# Patient Record
Sex: Female | Born: 1985 | Hispanic: No | Marital: Married
Health system: Southern US, Community
[De-identification: ages and names within clinical notes are randomized; demographics above are authoritative.]

## PROBLEM LIST (undated history)

## (undated) DIAGNOSIS — Z789 Other specified health status: Secondary | ICD-10-CM

## (undated) HISTORY — PX: NO PAST SURGERIES: SHX2092

---

## 2015-05-28 ENCOUNTER — Other Ambulatory Visit (HOSPITAL_COMMUNITY): Payer: Self-pay | Admitting: Internal Medicine

## 2015-05-28 ENCOUNTER — Ambulatory Visit (HOSPITAL_COMMUNITY)
Admission: RE | Admit: 2015-05-28 | Discharge: 2015-05-28 | Disposition: A | Discharge status: Home / Self Care | Payer: 59 | Source: Ambulatory Visit | Attending: Internal Medicine | Admitting: Internal Medicine

## 2015-05-28 DIAGNOSIS — M150 Primary generalized (osteo)arthritis: Secondary | ICD-10-CM

## 2015-05-28 DIAGNOSIS — M542 Cervicalgia: Secondary | ICD-10-CM | POA: Diagnosis not present

## 2015-05-28 DIAGNOSIS — M1990 Unspecified osteoarthritis, unspecified site: Secondary | ICD-10-CM | POA: Insufficient documentation

## 2016-10-04 IMAGING — DX DG CERVICAL SPINE COMPLETE 4+V
6 series · 6 of 6 positions shown · non-contrast
Comparison: None.

CLINICAL DATA: Neck pain x2 weeks, osteoarthritis

EXAM:
CERVICAL SPINE - COMPLETE 4+ VIEW

[c-spine lat]
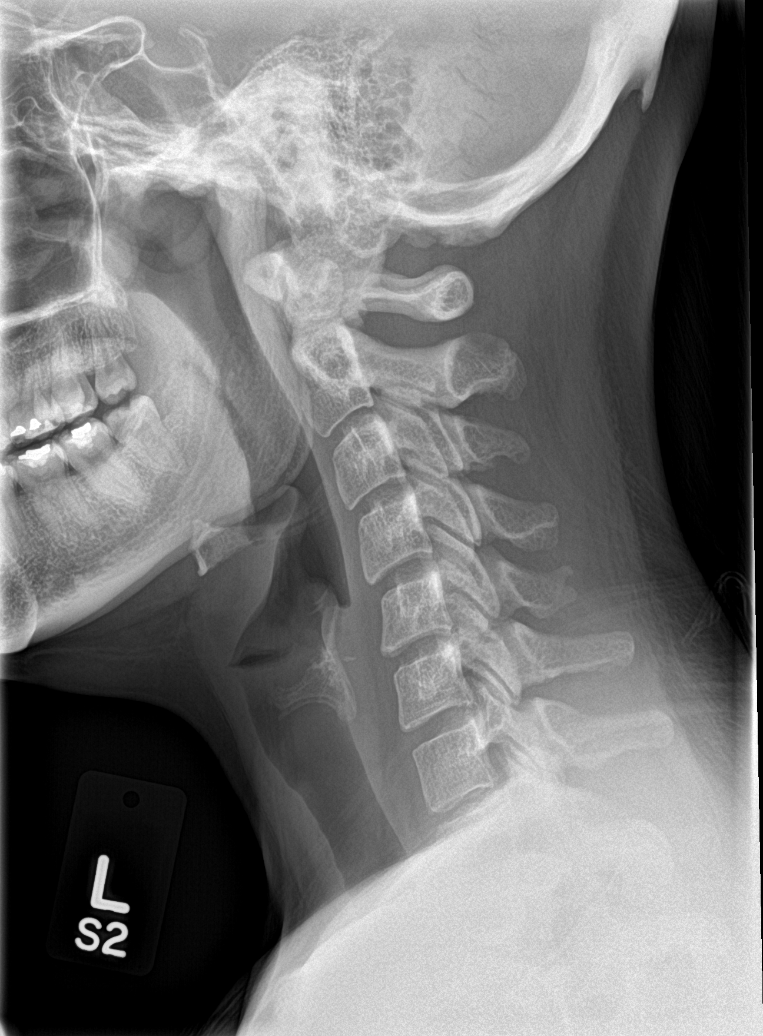

[c-spine obl (1 of 2)]
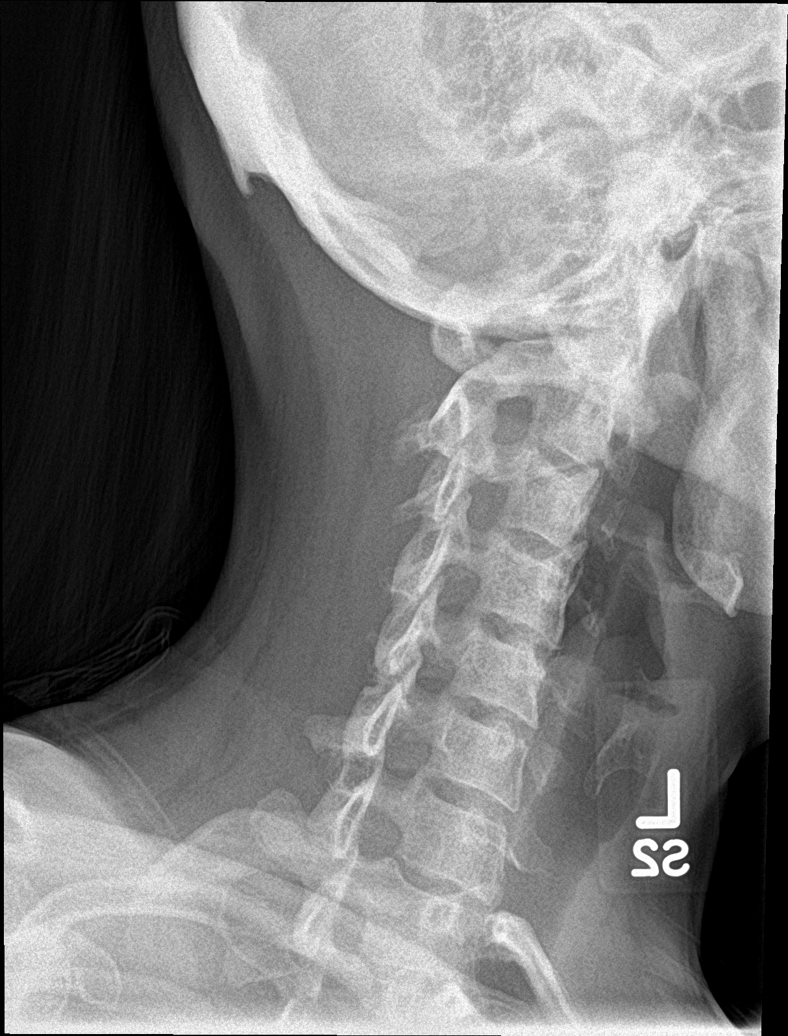

[c-spine obl (2 of 2)]
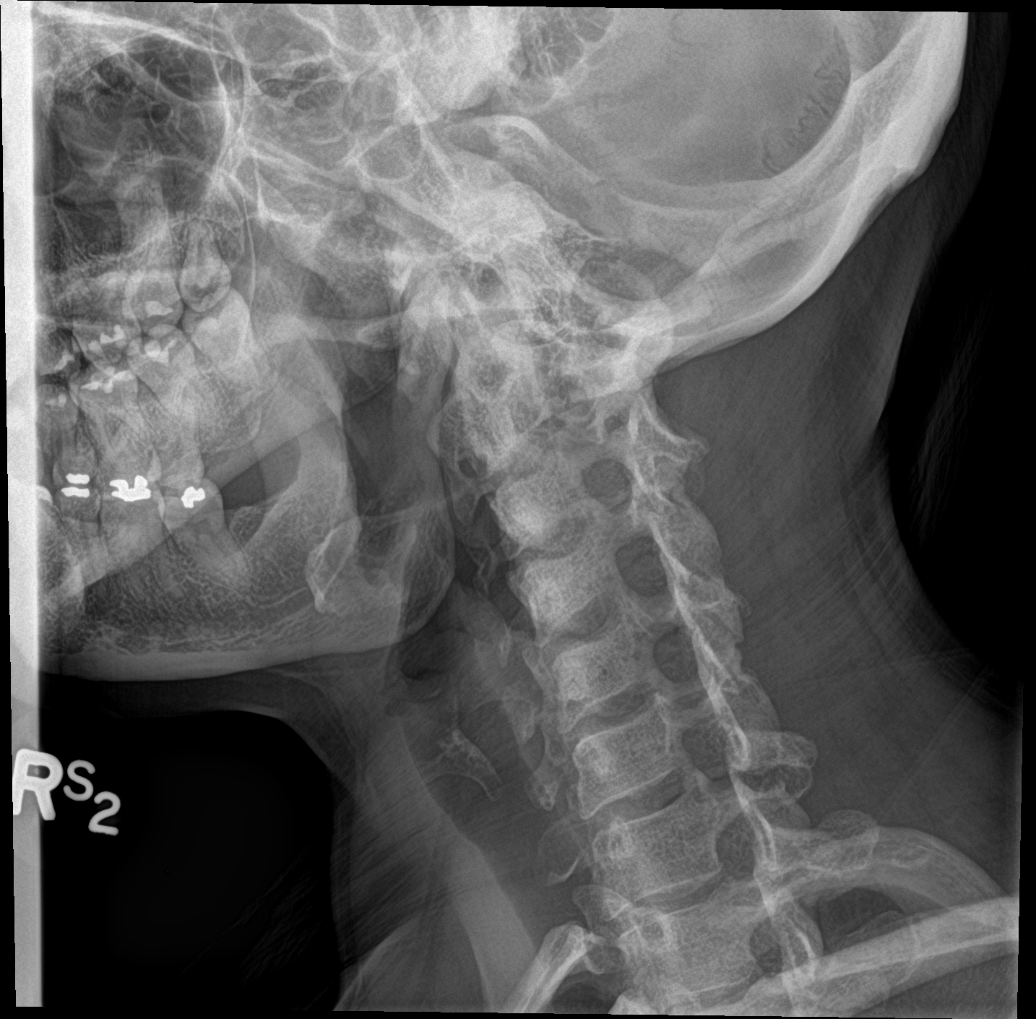

[c-spine ap]
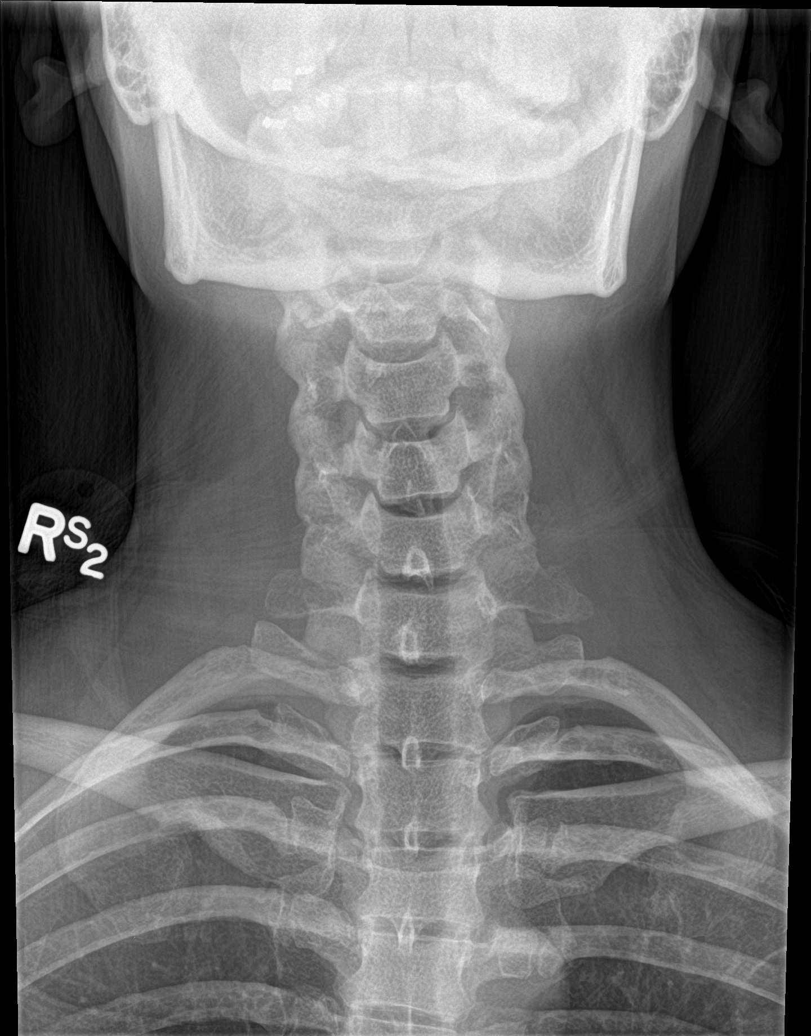

[c-spine open mouth]
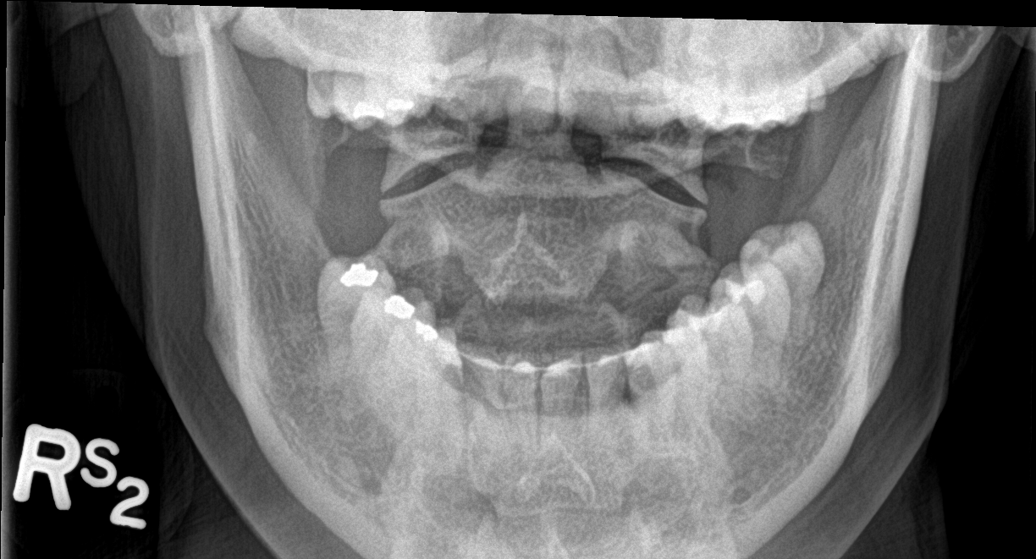

[[person_name]]
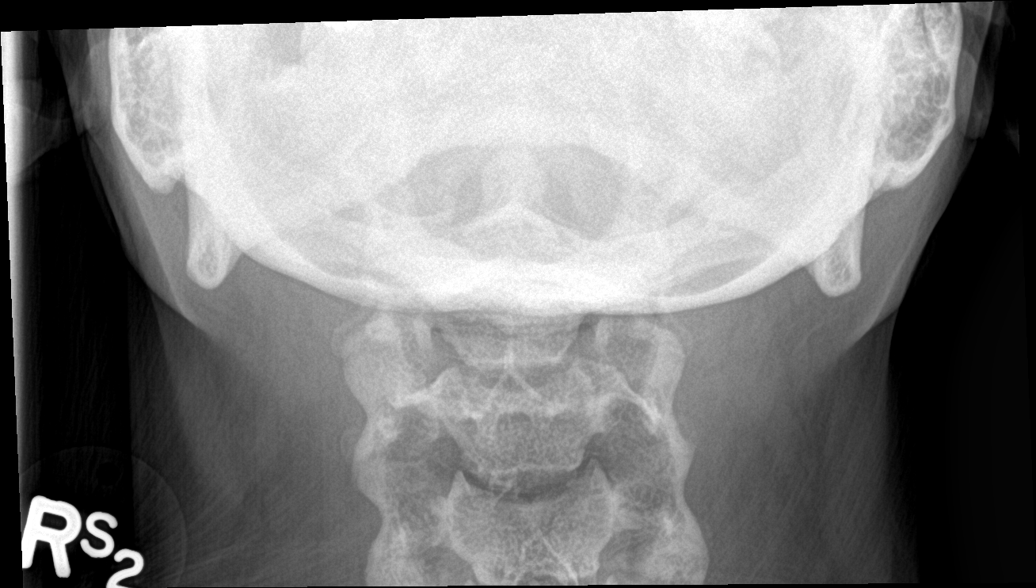

[6 of 6 positions shown; findings below may reference images not displayed]

FINDINGS: Cervical spine is visualized C7-T1 on the lateral view.

Reversal of the normal cervical lordosis, likely positional.

No evidence of fracture or dislocation. Vertebral body heights and
intervertebral disc spaces are maintained. Dens appears intact.
Lateral masses of C1 are symmetric.

No prevertebral soft tissue swelling.

Bilateral neural foramina are patent.

Visualized lung apices are clear.
IMPRESSION: Normal cervical spine radiographs.

## 2018-02-19 LAB — OB RESULTS CONSOLE GC/CHLAMYDIA
CHLAMYDIA, DNA PROBE: NEGATIVE
Gonorrhea: NEGATIVE

## 2018-02-19 LAB — OB RESULTS CONSOLE HIV ANTIBODY (ROUTINE TESTING): HIV: NONREACTIVE

## 2018-02-19 LAB — OB RESULTS CONSOLE RPR: RPR: NONREACTIVE

## 2018-02-19 LAB — OB RESULTS CONSOLE GBS: GBS: POSITIVE

## 2018-02-19 LAB — OB RESULTS CONSOLE ABO/RH: RH Type: POSITIVE

## 2018-02-19 LAB — OB RESULTS CONSOLE ANTIBODY SCREEN: ANTIBODY SCREEN: NEGATIVE

## 2018-02-19 LAB — OB RESULTS CONSOLE RUBELLA ANTIBODY, IGM: Rubella: IMMUNE

## 2018-02-19 LAB — OB RESULTS CONSOLE HEPATITIS B SURFACE ANTIGEN: Hepatitis B Surface Ag: NEGATIVE

## 2018-07-31 NOTE — L&D Delivery Note (Signed)
Patient was C/C/+2 and pushed for about 15 minutes with epidural.    NSVD  female infant, Apgars 9,9, weight P.   The patient had a midline second degree laceration repaired with 2-0. Fundus was firm. EBL was expected amount. Placenta was delivered intact. Vagina was clear.  Delayed cord clamping done for 30-60 seconds while warming baby. Baby was vigorous and doing skin to skin with mother.  Vicki Bright

## 2018-08-22 ENCOUNTER — Other Ambulatory Visit: Payer: Self-pay | Admitting: Obstetrics and Gynecology

## 2018-08-23 ENCOUNTER — Encounter (HOSPITAL_COMMUNITY): Payer: Self-pay

## 2018-08-23 ENCOUNTER — Inpatient Hospital Stay (HOSPITAL_COMMUNITY)
Admission: AD | Admit: 2018-08-23 | Discharge: 2018-08-25 | DRG: 807 | Disposition: A | Discharge status: Home / Self Care | Payer: Medicaid Other | Attending: Obstetrics and Gynecology | Admitting: Obstetrics and Gynecology

## 2018-08-23 ENCOUNTER — Other Ambulatory Visit: Payer: Self-pay

## 2018-08-23 ENCOUNTER — Inpatient Hospital Stay (HOSPITAL_COMMUNITY): Payer: Medicaid Other | Admitting: Anesthesiology

## 2018-08-23 DIAGNOSIS — O26893 Other specified pregnancy related conditions, third trimester: Secondary | ICD-10-CM | POA: Diagnosis present

## 2018-08-23 DIAGNOSIS — O9902 Anemia complicating childbirth: Secondary | ICD-10-CM | POA: Diagnosis present

## 2018-08-23 DIAGNOSIS — D649 Anemia, unspecified: Secondary | ICD-10-CM | POA: Diagnosis present

## 2018-08-23 DIAGNOSIS — O99824 Streptococcus B carrier state complicating childbirth: Secondary | ICD-10-CM | POA: Diagnosis present

## 2018-08-23 DIAGNOSIS — Z23 Encounter for immunization: Secondary | ICD-10-CM

## 2018-08-23 DIAGNOSIS — Z3A39 39 weeks gestation of pregnancy: Secondary | ICD-10-CM

## 2018-08-23 DIAGNOSIS — Z3490 Encounter for supervision of normal pregnancy, unspecified, unspecified trimester: Secondary | ICD-10-CM

## 2018-08-23 HISTORY — DX: Other specified health status: Z78.9

## 2018-08-23 LAB — CBC
HCT: 34 % — ABNORMAL LOW (ref 36.0–46.0)
HCT: 32.2 % — ABNORMAL LOW (ref 36.0–46.0)
HEMOGLOBIN: 10.1 g/dL — AB (ref 12.0–15.0)
Hemoglobin: 10.6 g/dL — ABNORMAL LOW (ref 12.0–15.0)
MCH: 25.3 pg — ABNORMAL LOW (ref 26.0–34.0)
MCH: 25.4 pg — ABNORMAL LOW (ref 26.0–34.0)
MCHC: 31.2 g/dL (ref 30.0–36.0)
MCHC: 31.4 g/dL (ref 30.0–36.0)
MCV: 81.1 fL (ref 80.0–100.0)
MCV: 80.9 fL (ref 80.0–100.0)
Platelets: 210 10*3/uL (ref 150–400)
Platelets: 170 10*3/uL (ref 150–400)
RBC: 4.19 MIL/uL (ref 3.87–5.11)
RBC: 3.98 MIL/uL (ref 3.87–5.11)
RDW: 13.9 % (ref 11.5–15.5)
RDW: 13.6 % (ref 11.5–15.5)
WBC: 6.8 10*3/uL (ref 4.0–10.5)
WBC: 10.6 10*3/uL — ABNORMAL HIGH (ref 4.0–10.5)
nRBC: 0 % (ref 0.0–0.2)

## 2018-08-23 LAB — RPR: RPR Ser Ql: NONREACTIVE

## 2018-08-23 LAB — ABO/RH: ABO/RH(D): B POS

## 2018-08-23 LAB — TYPE AND SCREEN
ABO/RH(D): B POS
ANTIBODY SCREEN: NEGATIVE

## 2018-08-23 MED ORDER — DIBUCAINE 1 % RE OINT: 1.0000 "application " | TOPICAL_OINTMENT | RECTAL | Status: DC | PRN

## 2018-08-23 MED ORDER — OXYTOCIN BOLUS FROM INFUSION
500.0000 mL | Freq: Once | INTRAVENOUS | Status: AC
  Administered 2018-08-23: 500 mL via INTRAVENOUS

## 2018-08-23 MED ORDER — MEASLES, MUMPS & RUBELLA VAC IJ SOLR
0.5000 mL | Freq: Once | INTRAMUSCULAR | Status: DC
  Filled 2018-08-23: qty 0.5

## 2018-08-23 MED ORDER — PRENATAL MULTIVITAMIN CH
1.0000 | ORAL_TABLET | Freq: Every day | ORAL | Status: DC
  Administered 2018-08-24: 1 via ORAL
  Filled 2018-08-23: qty 1

## 2018-08-23 MED ORDER — BENZOCAINE-MENTHOL 20-0.5 % EX AERO
1.0000 "application " | INHALATION_SPRAY | CUTANEOUS | Status: DC | PRN
  Administered 2018-08-23 (×2): 1 via TOPICAL
  Filled 2018-08-23 (×3): qty 56

## 2018-08-23 MED ORDER — OXYCODONE-ACETAMINOPHEN 5-325 MG PO TABS: 2.0000 | ORAL_TABLET | ORAL | Status: DC | PRN

## 2018-08-23 MED ORDER — FENTANYL 2.5 MCG/ML BUPIVACAINE 1/10 % EPIDURAL INFUSION (WH - ANES)
14.0000 mL/h | INTRAMUSCULAR | Status: DC | PRN
  Administered 2018-08-23: 14 mL/h via EPIDURAL

## 2018-08-23 MED ORDER — ZOLPIDEM TARTRATE 5 MG PO TABS: 5.0000 mg | ORAL_TABLET | Freq: Every evening | ORAL | Status: DC | PRN

## 2018-08-23 MED ORDER — INFLUENZA VAC SPLIT QUAD 0.5 ML IM SUSY
0.5000 mL | PREFILLED_SYRINGE | INTRAMUSCULAR | Status: AC
  Administered 2018-08-24: 0.5 mL via INTRAMUSCULAR
  Filled 2018-08-23: qty 0.5

## 2018-08-23 MED ORDER — EPHEDRINE 5 MG/ML INJ
10.0000 mg | INTRAVENOUS | Status: DC | PRN
  Filled 2018-08-23: qty 2

## 2018-08-23 MED ORDER — SODIUM CHLORIDE 0.9% FLUSH: 3.0000 mL | INTRAVENOUS | Status: DC | PRN

## 2018-08-23 MED ORDER — SODIUM CHLORIDE 0.9 % IV SOLN: 250.0000 mL | INTRAVENOUS | Status: DC | PRN

## 2018-08-23 MED ORDER — DIPHENHYDRAMINE HCL 50 MG/ML IJ SOLN: 12.5000 mg | INTRAMUSCULAR | Status: DC | PRN

## 2018-08-23 MED ORDER — SODIUM CHLORIDE 0.9 % IV SOLN
5.0000 10*6.[IU] | Freq: Once | INTRAVENOUS | Status: AC
  Administered 2018-08-23: 5 10*6.[IU] via INTRAVENOUS
  Filled 2018-08-23: qty 5

## 2018-08-23 MED ORDER — SOD CITRATE-CITRIC ACID 500-334 MG/5ML PO SOLN: 30.0000 mL | ORAL | Status: DC | PRN

## 2018-08-23 MED ORDER — LACTATED RINGERS IV SOLN: 500.0000 mL | INTRAVENOUS | Status: DC | PRN

## 2018-08-23 MED ORDER — FENTANYL 2.5 MCG/ML BUPIVACAINE 1/10 % EPIDURAL INFUSION (WH - ANES)
INTRAMUSCULAR | Status: AC
  Filled 2018-08-23: qty 100

## 2018-08-23 MED ORDER — METHYLERGONOVINE MALEATE 0.2 MG PO TABS: 0.2000 mg | ORAL_TABLET | ORAL | Status: DC | PRN

## 2018-08-23 MED ORDER — FERROUS SULFATE 325 (65 FE) MG PO TABS
325.0000 mg | ORAL_TABLET | Freq: Two times a day (BID) | ORAL | Status: DC
  Administered 2018-08-23 – 2018-08-25 (×5): 325 mg via ORAL
  Filled 2018-08-23 (×5): qty 1

## 2018-08-23 MED ORDER — METHYLERGONOVINE MALEATE 0.2 MG/ML IJ SOLN: 0.2000 mg | INTRAMUSCULAR | Status: DC | PRN

## 2018-08-23 MED ORDER — LACTATED RINGERS IV SOLN
INTRAVENOUS | Status: DC
  Administered 2018-08-23: 01:00:00 via INTRAVENOUS

## 2018-08-23 MED ORDER — LIDOCAINE-EPINEPHRINE (PF) 2 %-1:200000 IJ SOLN
INTRAMUSCULAR | Status: DC | PRN
  Administered 2018-08-23: 4 mL via EPIDURAL
  Administered 2018-08-23: 3 mL via EPIDURAL

## 2018-08-23 MED ORDER — SODIUM CHLORIDE 0.9% FLUSH: 3.0000 mL | Freq: Two times a day (BID) | INTRAVENOUS | Status: DC

## 2018-08-23 MED ORDER — ACETAMINOPHEN 325 MG PO TABS: 650.0000 mg | ORAL_TABLET | ORAL | Status: DC | PRN

## 2018-08-23 MED ORDER — TETANUS-DIPHTH-ACELL PERTUSSIS 5-2.5-18.5 LF-MCG/0.5 IM SUSP: 0.5000 mL | Freq: Once | INTRAMUSCULAR | Status: DC

## 2018-08-23 MED ORDER — LACTATED RINGERS IV SOLN: 500.0000 mL | Freq: Once | INTRAVENOUS | Status: DC

## 2018-08-23 MED ORDER — TERBUTALINE SULFATE 1 MG/ML IJ SOLN
0.2500 mg | Freq: Once | INTRAMUSCULAR | Status: DC | PRN
  Filled 2018-08-23: qty 1

## 2018-08-23 MED ORDER — PHENYLEPHRINE 40 MCG/ML (10ML) SYRINGE FOR IV PUSH (FOR BLOOD PRESSURE SUPPORT)
80.0000 ug | PREFILLED_SYRINGE | INTRAVENOUS | Status: DC | PRN
  Filled 2018-08-23: qty 10

## 2018-08-23 MED ORDER — LIDOCAINE HCL (PF) 1 % IJ SOLN
30.0000 mL | INTRAMUSCULAR | Status: AC | PRN
  Administered 2018-08-23: 30 mL via SUBCUTANEOUS
  Filled 2018-08-23: qty 30

## 2018-08-23 MED ORDER — SENNOSIDES-DOCUSATE SODIUM 8.6-50 MG PO TABS
2.0000 | ORAL_TABLET | ORAL | Status: DC
  Administered 2018-08-23 – 2018-08-25 (×2): 2 via ORAL
  Filled 2018-08-23 (×2): qty 2

## 2018-08-23 MED ORDER — MISOPROSTOL 25 MCG QUARTER TABLET
25.0000 ug | ORAL_TABLET | ORAL | Status: DC | PRN
  Administered 2018-08-23: 25 ug via VAGINAL
  Filled 2018-08-23 (×2): qty 1

## 2018-08-23 MED ORDER — ONDANSETRON HCL 4 MG/2ML IJ SOLN: 4.0000 mg | INTRAMUSCULAR | Status: DC | PRN

## 2018-08-23 MED ORDER — PENICILLIN G 3 MILLION UNITS IVPB - SIMPLE MED
3.0000 10*6.[IU] | INTRAVENOUS | Status: DC
  Filled 2018-08-23 (×2): qty 100

## 2018-08-23 MED ORDER — SIMETHICONE 80 MG PO CHEW: 80.0000 mg | CHEWABLE_TABLET | ORAL | Status: DC | PRN

## 2018-08-23 MED ORDER — IBUPROFEN 800 MG PO TABS
800.0000 mg | ORAL_TABLET | Freq: Three times a day (TID) | ORAL | Status: DC
  Administered 2018-08-23 – 2018-08-25 (×7): 800 mg via ORAL
  Filled 2018-08-23 (×7): qty 1

## 2018-08-23 MED ORDER — WITCH HAZEL-GLYCERIN EX PADS
1.0000 "application " | MEDICATED_PAD | CUTANEOUS | Status: DC | PRN
  Administered 2018-08-23: 1 via TOPICAL

## 2018-08-23 MED ORDER — ACETAMINOPHEN 325 MG PO TABS
650.0000 mg | ORAL_TABLET | ORAL | Status: DC | PRN
  Administered 2018-08-23 – 2018-08-24 (×3): 650 mg via ORAL
  Filled 2018-08-23 (×3): qty 2

## 2018-08-23 MED ORDER — PHENYLEPHRINE 40 MCG/ML (10ML) SYRINGE FOR IV PUSH (FOR BLOOD PRESSURE SUPPORT)
PREFILLED_SYRINGE | INTRAVENOUS | Status: AC
  Filled 2018-08-23: qty 20

## 2018-08-23 MED ORDER — ONDANSETRON HCL 4 MG PO TABS: 4.0000 mg | ORAL_TABLET | ORAL | Status: DC | PRN

## 2018-08-23 MED ORDER — ONDANSETRON HCL 4 MG/2ML IJ SOLN: 4.0000 mg | Freq: Four times a day (QID) | INTRAMUSCULAR | Status: DC | PRN

## 2018-08-23 MED ORDER — OXYTOCIN 40 UNITS IN NORMAL SALINE INFUSION - SIMPLE MED
2.5000 [IU]/h | INTRAVENOUS | Status: DC
  Filled 2018-08-23: qty 1000

## 2018-08-23 MED ORDER — DIPHENHYDRAMINE HCL 25 MG PO CAPS: 25.0000 mg | ORAL_CAPSULE | Freq: Four times a day (QID) | ORAL | Status: DC | PRN

## 2018-08-23 MED ORDER — COCONUT OIL OIL: 1.0000 "application " | TOPICAL_OIL | Status: DC | PRN

## 2018-08-23 MED ORDER — MAGNESIUM HYDROXIDE 400 MG/5ML PO SUSP: 30.0000 mL | ORAL | Status: DC | PRN

## 2018-08-23 MED ORDER — OXYCODONE-ACETAMINOPHEN 5-325 MG PO TABS: 1.0000 | ORAL_TABLET | ORAL | Status: DC | PRN

## 2018-08-23 NOTE — Anesthesia Postprocedure Evaluation (Signed)
Anesthesia Post Note  Patient: Vicki Bright  Procedure(s) Performed: AN AD HOC LABOR EPIDURAL     Patient location during evaluation: Mother Baby Anesthesia Type: Epidural Level of consciousness: awake Pain management: pain level controlled Vital Signs Assessment: post-procedure vital signs reviewed and stable Respiratory status: spontaneous breathing Cardiovascular status: stable Postop Assessment: patient able to bend at knees and epidural receding Anesthetic complications: no    Last Vitals:  Vitals:   08/23/18 0531 08/23/18 0611  BP: 113/60 112/66  Pulse: 79 62  Resp: 17   Temp:  36.9 C  SpO2:  100%    Last Pain:  Vitals:   08/23/18 0611  TempSrc: Axillary  PainSc:    Pain Goal:                   Edison Pace

## 2018-08-23 NOTE — H&P (Signed)
33 y.o. [redacted]w[redacted]d  G2P1002 comes in for induction at term.  Otherwise has good fetal movement and no bleeding.  Past Medical History:  Diagnosis Date  . Medical history non-contributory     Past Surgical History:  Procedure Laterality Date  . NO PAST SURGERIES      OB History  Gravida Para Term Preterm AB Living  2 1 1     2   SAB TAB Ectopic Multiple Live Births        1 2    # Outcome Date GA Lbr Len/2nd Weight Sex Delivery Anes PTL Lv  2 Current           1A Para 03/19/13 [redacted]w[redacted]d  2126 g M Vag-Spont   LIV  1B Term 03/19/13 [redacted]w[redacted]d  2013 g F    LIV    Social History   Socioeconomic History  . Marital status: Married    Spouse name: Not on file  . Number of children: Not on file  . Years of education: Not on file  . Highest education level: Not on file  Occupational History  . Not on file  Social Needs  . Financial resource strain: Not on file  . Food insecurity:    Worry: Not on file    Inability: Not on file  . Transportation needs:    Medical: Not on file    Non-medical: Not on file  Tobacco Use  . Smoking status: Not on file  Substance and Sexual Activity  . Alcohol use: Not on file  . Drug use: Not on file  . Sexual activity: Not on file  Lifestyle  . Physical activity:    Days per week: Not on file    Minutes per session: Not on file  . Stress: Not on file  Relationships  . Social connections:    Talks on phone: Not on file    Gets together: Not on file    Attends religious service: Not on file    Active member of club or organization: Not on file    Attends meetings of clubs or organizations: Not on file    Relationship status: Not on file  . Intimate partner violence:    Fear of current or ex partner: Not on file    Emotionally abused: Not on file    Physically abused: Not on file    Forced sexual activity: Not on file  Other Topics Concern  . Not on file  Social History Narrative  . Not on file   Patient has no allergy information on record.     Prenatal Transfer Tool  Maternal Diabetes: No Genetic Screening: Normal Maternal Ultrasounds/Referrals: Normal Fetal Ultrasounds or other Referrals:  None Maternal Substance Abuse:  No Significant Maternal Medications:  None Significant Maternal Lab Results: Lab values include: Group B Strep positive  Other PNC: uncomplicated.    Vitals:   08/23/18 0311 08/23/18 0313 08/23/18 0318 08/23/18 0321  BP: 122/62 122/62 125/61 (!) 124/94  Pulse: 90 90 86 (!) 108  Resp:   18 18  Temp:      TempSrc:      Weight:      Height:        Lungs/Cor:  NAD Abdomen:  soft, gravid Ex:  no cords, erythema SVE:  On admission, 0.5/80/-2 FHTs:  130s, good STV, NST R; Cat 1 tracing. Toco:  q occ   A/P   Term for induction.   GBS Urine +--PCN.   Loney Laurence

## 2018-08-23 NOTE — Anesthesia Procedure Notes (Signed)
Epidural Patient location during procedure: OB Start time: 08/23/2018 2:50 AM End time: 08/23/2018 3:05 AM  Staffing Anesthesiologist: Elmer Picker, MD Performed: anesthesiologist   Preanesthetic Checklist Completed: patient identified, pre-op evaluation, timeout performed, IV checked, risks and benefits discussed and monitors and equipment checked  Epidural Patient position: sitting Prep: site prepped and draped and DuraPrep Patient monitoring: continuous pulse ox, blood pressure, heart rate and cardiac monitor Approach: midline Location: L3-L4 Injection technique: LOR air  Needle:  Needle type: Tuohy  Needle gauge: 17 G Needle length: 9 cm Needle insertion depth: 7 cm Catheter type: closed end flexible Catheter size: 19 Gauge Catheter at skin depth: 13 cm Test dose: negative  Assessment Sensory level: T8 Events: blood not aspirated, injection not painful, no injection resistance, negative IV test and no paresthesia  Additional Notes Patient identified. Risks/Benefits/Options discussed with patient including but not limited to bleeding, infection, nerve damage, paralysis, failed block, incomplete pain control, headache, blood pressure changes, nausea, vomiting, reactions to medication both or allergic, itching and postpartum back pain. Confirmed with bedside nurse the patient's most recent platelet count. Confirmed with patient that they are not currently taking any anticoagulation, have any bleeding history or any family history of bleeding disorders. Patient expressed understanding and wished to proceed. All questions were answered. Sterile technique was used throughout the entire procedure. Please see nursing notes for vital signs. Test dose was given through epidural catheter and negative prior to continuing to dose epidural or start infusion. Warning signs of high block given to the patient including shortness of breath, tingling/numbness in hands, complete motor block,  or any concerning symptoms with instructions to call for help. Patient was given instructions on fall risk and not to get out of bed. All questions and concerns addressed with instructions to call with any issues or inadequate analgesia.  Reason for block:procedure for pain

## 2018-08-23 NOTE — Lactation Note (Signed)
This note was copied from a baby's chart. Lactation Consultation Note:  Mother reports that she has 33 yr old twin girls.  She reports that she breastfed for 2-3 weeks with no pumping. She reports that she didn't know that she could pump her milk with her twins.   Mother breastfed infant at 8am. She has given several bottles with nipples. She reports attempting to breastfeed infant but he was too sleepy.  Infant took 8-20 ml. of formula.   Lots of education on supply and demand.  Mother reports that she wants to breastfeed infant.  Advised mother to breastfeed with all feeding cue's. Advised to breastfeed 8-12 times or more in 24 hours. Discussed cluster feeding.   Reviewed hand expression and mother able to hand express large drops.  Assist mother with latching infant on in football hold. Infant took a few sucks.  No observed swallows.  Infant placed in cross cradle hold. No sustained latch achieved.   Staff nurse to get hand pump and offer instructions.  Discussed using a DEBP as needed.  Mother was given [facility] brochure with information on all LC services.  Mother to page staff nurse or [facility] with next feeding assistance.   Patient Name: Boy Nadalie Leonetti [id number] Date: 08/23/2018 Reason for consult: Initial assessment   Maternal Data Has patient been taught Hand Expression?: Yes Does the patient have breastfeeding experience prior to this delivery?: Yes  Feeding Feeding Type: Breast Fed Nipple Type: Slow - flow  LATCH Score Latch: Repeated attempts needed to sustain latch, nipple held in mouth throughout feeding, stimulation needed to elicit sucking reflex.  Audible Swallowing: None  Type of Nipple: Everted at rest and after stimulation  Comfort (Breast/Nipple): Soft / non-tender  Hold (Positioning): Assistance needed to correctly position infant at breast and maintain latch.  LATCH Score: 6  Interventions Interventions: Breast feeding basics reviewed;Assisted with  latch;Skin to skin;Hand express;Breast compression;Adjust position;Support pillows;Position options;Expressed milk  Lactation Tools Discussed/Used     Consult Status Consult Status: Follow-up Date: 08/24/18 Follow-up type: In-patient    Stevan Born [facility] 08/23/2018, 3:53 PM

## 2018-08-23 NOTE — Lactation Note (Signed)
This note was copied from a baby's chart. Lactation Consultation Note  Patient Name: Vicki Bright [id number] Date: 08/23/2018 Reason for consult: Follow-up assessment P2, 18 hour female infant. Per mom, she stopped breastfeeding her twins after 2 weeks. Per parents, infant had 4 voids and 2 stools. Per mom, infant not latching to breast. Mom was given a harmony hand pump earlier. Mom interested in applying for [facility] services. Mom hand expressed 5 ml of colostrum that was spoon feed to infant. Mom latched infant to right breast using cross cradle position, LC advised nose to breast, infant sustained latch, infant suckling with swallows heard.  Infant had breastfeed for 12 minutes and was still breastfeeding when [facility] left room. LC reminded mom to undress infant and do STS, discussed tips to keep infant awake while feeding. Breastfeed according hunger cues and not exceed 3 hours without breastfeeding infant. Mom knows to call Nurse or LC if she has questions, concerns or need further assistance with latching infant to breast.    Maternal Data    Feeding Feeding Type: Breast Fed  LATCH Score Latch: Repeated attempts needed to sustain latch, nipple held in mouth throughout feeding, stimulation needed to elicit sucking reflex.  Audible Swallowing: Spontaneous and intermittent  Type of Nipple: Everted at rest and after stimulation  Comfort (Breast/Nipple): Soft / non-tender  Hold (Positioning): Assistance needed to correctly position infant at breast and maintain latch.  LATCH Score: 8  Interventions Interventions: Assisted with latch;Skin to skin;Breast massage;Hand express;Adjust position;Support pillows;Position options;Expressed milk  Lactation Tools Discussed/Used     Consult Status      Danelle Earthly 08/23/2018, 10:08 PM

## 2018-08-23 NOTE — Anesthesia Preprocedure Evaluation (Signed)
Anesthesia Evaluation  Patient identified by MRN, date of birth, ID band Patient awake    Reviewed: Allergy & Precautions, NPO status , Patient's Chart, lab work & pertinent test results  Airway Mallampati: I  TM Distance: >3 FB Neck ROM: Full    Dental no notable dental hx.    Pulmonary neg pulmonary ROS,    Pulmonary exam normal breath sounds clear to auscultation       Cardiovascular negative cardio ROS Normal cardiovascular exam Rhythm:Regular Rate:Normal     Neuro/Psych negative neurological ROS  negative psych ROS   GI/Hepatic negative GI ROS, Neg liver ROS,   Endo/Other  negative endocrine ROS  Renal/GU negative Renal ROS  negative genitourinary   Musculoskeletal negative musculoskeletal ROS (+)   Abdominal   Peds  Hematology  (+) Blood dyscrasia, anemia ,   Anesthesia Other Findings   Reproductive/Obstetrics (+) Pregnancy                             Anesthesia Physical Anesthesia Plan  ASA: II  Anesthesia Plan: Epidural   Post-op Pain Management:    Induction:   PONV Risk Score and Plan: Treatment may vary due to age or medical condition  Airway Management Planned: Natural Airway  Additional Equipment:   Intra-op Plan:   Post-operative Plan:   Informed Consent: I have reviewed the patients History and Physical, chart, labs and discussed the procedure including the risks, benefits and alternatives for the proposed anesthesia with the patient or authorized representative who has indicated his/her understanding and acceptance.       Plan Discussed with: Anesthesiologist  Anesthesia Plan Comments: (Patient identified. Risks, benefits, options discussed with patient including but not limited to bleeding, infection, nerve damage, paralysis, failed block, incomplete pain control, headache, blood pressure changes, nausea, vomiting, reactions to medication, itching, and  post partum back pain. Confirmed with bedside nurse the patient's most recent platelet count. Confirmed with the patient that they are not taking any anticoagulation, have any bleeding history or any family history of bleeding disorders. Patient expressed understanding and wishes to proceed. All questions were answered. )        Anesthesia Quick Evaluation

## 2018-08-23 NOTE — Progress Notes (Signed)
Patient is eating, ambulating, voiding.  Pain control is good.  Appropriate lochia, no complaints.  Vitals:   08/23/18 0523 08/23/18 0531 08/23/18 0611 08/23/18 0800  BP: 118/62 113/60 112/66 95/62  Pulse: 69 79 62 60  Resp: 16 17    Temp: 97.9 F (36.6 C)  98.4 F (36.9 C) 98.2 F (36.8 C)  TempSrc: Oral  Axillary Axillary  SpO2:   100%   Weight:      Height:        Fundus firm Abd: soft, nontender Ext: no calf tenderness  Lab Results  Component Value Date   WBC 10.6 (H) 08/23/2018   HGB 10.1 (L) 08/23/2018   HCT 32.2 (L) 08/23/2018   MCV 80.9 08/23/2018   PLT 170 08/23/2018    --/--/B POS, B POS Performed at [facility], [address], [place], [place] [address]  (01/24 0113)  A/P Post partum day 0. Doing well circ desired, reviewed risks/benefits/complications, consent obtained.  Routine care.   Philip Aspen

## 2018-08-24 NOTE — Progress Notes (Signed)
Patient is eating, ambulating, voiding.  Pain control is good.  Appropriate lochia, no complaints except persistent restless leg feeling since prior to delivery.  Vitals:   08/23/18 1033 08/23/18 1436 08/23/18 1826 08/24/18 0657  BP: 101/78 (!) 94/58 114/67 101/67  Pulse: 75 71 68 83  Resp: 18 18 18 16   Temp: 98.2 F (36.8 C) 98.2 F (36.8 C) 97.9 F (36.6 C)   TempSrc:  Oral Oral   SpO2: 99%   99%  Weight:      Height:        Fundus firm Abd: soft, nontender Ext: no calf tenderness  Lab Results  Component Value Date   WBC 10.6 (H) 08/23/2018   HGB 10.1 (L) 08/23/2018   HCT 32.2 (L) 08/23/2018   MCV 80.9 08/23/2018   PLT 170 08/23/2018    --/--/B POS, B POS Performed at [facility], [address], [place], [place] [address]  (01/24 0113)  A/P Post partum day 1 circ outpatient Will ask RN to place SCDs.  Routine care.  Expect d/c 1/26    Philip Aspen

## 2018-08-24 NOTE — Plan of Care (Signed)
  Problem: Activity: Goal: Ability to tolerate increased activity will improve Note:  Patient ambulating well. SCD's placed on patient per MD order while patient in bed. Maudry Diego [place]    Problem: Role Relationship: Goal: Ability to demonstrate positive interaction with newborn will improve Note:  Patient stated she would attempt to breast feed baby after multiple bottles this morning. Offered assistance; however, patient stated she would call for assistance if needed. Earl Gala, Linda Hedges [place]

## 2018-08-24 NOTE — Progress Notes (Signed)
DEBP set up for patient and was educated on use. Patient pumped for 15 minutes and expressed a few drops. Danelle Earthly, lactation consultant, will be in to see patient.   Venida Jarvis, RN

## 2018-08-25 MED ORDER — BENZOCAINE-MENTHOL 20-0.5 % EX AERO: 1.0000 "application " | INHALATION_SPRAY | CUTANEOUS | 0 refills | Status: AC | PRN

## 2018-08-25 MED ORDER — SENNOSIDES-DOCUSATE SODIUM 8.6-50 MG PO TABS: 2.0000 | ORAL_TABLET | ORAL | 1 refills | Status: AC

## 2018-08-25 MED ORDER — DIBUCAINE 1 % RE OINT: 1.0000 "application " | TOPICAL_OINTMENT | RECTAL | 0 refills | Status: AC | PRN

## 2018-08-25 NOTE — Lactation Note (Signed)
This note was copied from a baby's chart. Lactation Consultation Note  Patient Name: Vicki Bright [id number] Date: 08/25/2018 Reason for consult: Follow-up assessment;Term  Visited with P3 Mom of term baby on day of discharge.  Baby 53 hrs old and at 4% weight loss.  Baby can latch easily, but Mom chose to formula feed by bottle all night.  No soreness or nipple trauma reported. Baby in CN currently.    DEBP set up at bedside, Mom has pumped once.  Explained importance of pumping whenever baby receives formula, to support a full milk supply.  Shared how much easier it would be for baby to latch to breast for each feeding, without having to pump due to supplemental bottles.  Mom said she would think about it.   Mom does not have WIC.  Shared information about pump rentals available in [employer] in lobby.    Mom encouraged to keep baby STS as much as possible, watching for feeding cues.  8-12 feedings per 24 hrs is goal.   Mom feels comfortable hand expressing, and she has a hand pump, and will take the double electric pump parts home.  Offered assistance with latch prior to discharge.   Consult Status Consult Status: Complete Date: 08/25/18 Follow-up type: Call as needed    [name] 08/25/2018, 9:37 AM

## 2018-08-25 NOTE — Lactation Note (Signed)
This note was copied from a baby's chart. Lactation Consultation Note  Patient Name: Vicki Bright [id number] Date: 08/25/2018 Reason for consult: Follow-up assessment;Term P3, 49 female infant with weight loss -2% for age. Per parents, infant had 8 voids and 7 stools since birth. LC entered room, Mom was breastfeeding infant on left breast using football hold position, audible swallows observed. Per mom, infant is breastfeeding 15-20 minutes most feeds.  Per mom, she feels infant is latching well and she is supplementing with formula 18-20 mls after feeding infant at breast. Mom started using DEBP and plans to pump every 3 hours to help with milk supply. Mom knows to call Nurse or LC if she has any questions, concerns or need assistance with latching infant to breast.   Maternal Data    Feeding Feeding Type: Bottle Fed - Formula Nipple Type: Slow - flow  LATCH Score Latch: Grasps breast easily, tongue down, lips flanged, rhythmical sucking.  Audible Swallowing: Spontaneous and intermittent  Type of Nipple: Everted at rest and after stimulation  Comfort (Breast/Nipple): Soft / non-tender  Hold (Positioning): No assistance needed to correctly position infant at breast.  LATCH Score: 10  Interventions    Lactation Tools Discussed/Used     Consult Status Consult Status: Follow-up Date: 08/26/18 Follow-up type: In-patient    Danelle Earthly 08/25/2018, 12:47 AM

## 2018-08-25 NOTE — Discharge Summary (Signed)
Obstetric Discharge Summary Reason for Admission: induction of labor Prenatal Procedures: ultrasound Intrapartum Procedures: spontaneous vaginal delivery Postpartum Procedures: none Complications-Operative and Postpartum: 2nd degree perineal laceration Hemoglobin  Date Value Ref Range Status  08/23/2018 10.1 (L) 12.0 - 15.0 g/dL Final   HCT  Date Value Ref Range Status  08/23/2018 32.2 (L) 36.0 - 46.0 % Final    Physical Exam:  General: alert and cooperative Lochia: appropriate Uterine Fundus: firm DVT Evaluation: No evidence of DVT seen on physical exam.  Discharge Diagnoses: Term Pregnancy-delivered  Discharge Information: Date: 08/25/2018 Activity: pelvic rest Diet: routine Medications: PNV, Ibuprofen and hemorrhoid meds Condition: stable Instructions: refer to practice specific booklet Discharge to: home Follow-up Information    Carrington Clamp, MD Follow up in 4 week(s).   Specialty:  Obstetrics and Gynecology Contact information: [address] [place] [address] [phone]           Newborn Data: Live born female  Birth Weight: 7 lb 13.9 oz (3569 g) APGAR: 9, 9  Newborn Delivery   Birth date/time:  08/23/2018 03:51:00 Delivery type:  Vaginal, Spontaneous     Home with mother.  Philip Aspen 08/25/2018, 8:56 AM

## 2020-04-23 ENCOUNTER — Other Ambulatory Visit: Payer: Self-pay | Admitting: Obstetrics and Gynecology

## 2020-04-23 DIAGNOSIS — N644 Mastodynia: Secondary | ICD-10-CM

## 2021-08-25 ENCOUNTER — Ambulatory Visit: Payer: Medicaid Other

## 2021-08-30 ENCOUNTER — Ambulatory Visit: Payer: Medicaid Other | Attending: Obstetrics and Gynecology | Admitting: Physical Therapy

## 2021-09-01 ENCOUNTER — Ambulatory Visit: Payer: Medicaid Other

## 2021-09-01 ENCOUNTER — Other Ambulatory Visit: Payer: Self-pay

## 2021-09-01 ENCOUNTER — Ambulatory Visit: Payer: Medicaid Other | Attending: Obstetrics and Gynecology

## 2021-09-01 DIAGNOSIS — M6281 Muscle weakness (generalized): Secondary | ICD-10-CM | POA: Diagnosis not present

## 2021-09-01 DIAGNOSIS — R279 Unspecified lack of coordination: Secondary | ICD-10-CM | POA: Insufficient documentation

## 2021-09-01 NOTE — Patient Instructions (Addendum)
The knack: Use this technique while coughing, laughing, sneezing, or with any activities that causes you to leak urine a little. Right before you perform one of these activities that increase pressure in the abdomen and pushes a little urine out, perform a pelvic floor muscle contraction and hold. If that does not completely stop the leaking, try tightening your thighs together in addition to performing a pelvic floor muscle contraction. Make sure you are not trying to stifle a cough, sneeze, or laugh; allow these activities in full as it will cause less pressure down into the bladder and pelvic floor muscles.  Access Code: [email or web address] URL: [email or web address] Date: 09/01/2021 Prepared by: Julio Alm  Exercises Supine Pelvic Floor Contraction - 2 x daily - 7 x weekly - 1 sets - 5 reps - 10 hold Quick Flick Pelvic Floor Contractions in Hooklying - 2 x daily - 7 x weekly - 2 sets - 10 reps - 1 hold

## 2021-09-01 NOTE — Therapy (Signed)
[facility] @ [facility] [address] [place], [place], [address] Phone: [phone]   Fax:  [phone]  Physical Therapy Evaluation  Patient Details  Name: Vicki Bright MRN: [id number] Date of Birth: 04/10/1986 Referring Provider (PT): Philip Aspen, DO   Encounter Date: 09/01/2021   PT End of Session - 09/01/21 1529     Visit Number 1    Date for PT Re-Evaluation 10/27/21    Authorization Type Medicaid Healthy [employer]    PT Start Time 1456    PT Stop Time 1529    PT Time Calculation (min) 33 min    Activity Tolerance Patient tolerated treatment well    Behavior During Therapy [facility] for tasks assessed/performed             Past Medical History:  Diagnosis Date   Medical history non-contributory     Past Surgical History:  Procedure Laterality Date   NO PAST SURGERIES      There were no vitals filed for this visit.    Subjective Assessment - 09/01/21 1457     Subjective Pt states that she is having stress incontinence with jumping, laughing, coughing, sneezing. She feels like this started around March 2022.    Patient Stated Goals To stop leaking    Currently in Pain? No/denies    Multiple Pain Sites No                [facility] PT Assessment - 09/01/21 0001       Assessment   Medical Diagnosis N39.3 (ICD-10-CM) - Stress incontinence (female) (female)    Referring Provider (PT) Philip Aspen, DO    Onset Date/Surgical Date 09/28/20    Next MD Visit none    Prior Therapy no      Precautions   Precautions None      Restrictions   Weight Bearing Restrictions No      Balance Screen   Has the patient fallen in the past 6 months No    Has the patient had a decrease in activity level because of a fear of falling?  No    Is the patient reluctant to leave their home because of a fear of falling?  No      Home Environment   Living Environment Private residence    Living Arrangements  Children;Spouse/significant other      Prior Function   Level of Independence Independent      Cognition   Overall Cognitive Status Within Functional Limits for tasks assessed                   No emotional/communication barriers or cognitive limitation. Patient is motivated to learn. Patient understands and agrees with treatment goals and plan. PT explains patient will be examined in standing, sitting, and lying down to see how their muscles and joints work. When they are ready, they will be asked to remove their underwear so PT can examine their perineum. The patient is also given the option of providing their own chaperone as one is not provided in our facility. The patient also has the right and is explained the right to defer or refuse any part of the evaluation or treatment including the internal exam. With the patient's consent, PT will use one gloved finger to gently assess the muscles of the pelvic floor, seeing how well it contracts and relaxes and if there is muscle symmetry. After, the patient will get dressed and PT and patient will discuss exam  findings and plan of care. PT and patient discuss plan of care, schedule, attendance policy and HEP activities.      Objective measurements completed on examination: See above findings.     Pelvic Floor Special Questions - 09/01/21 0001     Prior Pelvic/Prostate Exam Yes    Are you Pregnant or attempting pregnancy? No    Prior Pregnancies Yes    Number of Pregnancies 2    Number of C-Sections 0    Number of Vaginal Deliveries 2   twins for first   Any difficulty with labor and deliveries No    Episiotomy Performed No    Diastasis Recti 1 finger width at umbilicus - unable to curl up without Bil UE    Currently Sexually Active Yes    Is this Painful No    History of sexually transmitted disease No    Marinoff Scale no problems    Urinary Leakage Yes    How often daily    Pad use no    Activities that cause leaking  Coughing;Sneezing;Laughing   jumping   Urinary urgency No    Urinary frequency every hour or less    Fecal incontinence No   used to have constipation   Fluid intake 8 glasses a day    Caffeine beverages yes    Falling out feeling (prolapse) Yes    Activities that cause feeling of prolapse randomly    Skin Integrity Intact    Scar Tear    Perineal Body/Introitus  Normal    External Palpation WNL    Prolapse Anterior Wall   grade 2   Pelvic Floor Internal Exam Pt identity confirmed and verbal consent provided for internal vaginal exam    Exam Type Vaginal    Sensation WNL    Palpation mild tenderness throughout first layer; pt denies tearing with deliveries, but scar tissue palpated at posteiror fourchette    Strength weak squeeze, no lift    Strength # of reps 6    Strength # of seconds 4    Tone normal              [facility] Adult PT Treatment/Exercise - 09/01/21 0001       Self-Care   Self-Care Other Self-Care Comments   the knack     Neuro Re-ed    Neuro Re-ed Details  Pelvic floor contraction training with VC/TCs and breath coordination; 10x quick flicks; 5 x 10 sec long holds                     PT Education - 09/01/21 2132     Education Details Pt education performed on pelvic floor anatomy, impairments, treatment details, the knack, and pressure management. [email or web address]    Person(s) Educated Patient    Methods Explanation;Demonstration;Tactile cues;Verbal cues;Handout    Comprehension Verbalized understanding              PT Short Term Goals - 09/01/21 2151       PT SHORT TERM GOAL #1   Title Pt will be independent with HEP.    Time 4    Period Weeks    Status New    Target Date 09/29/21      PT SHORT TERM GOAL #2   Title Pt will be independent in the knack in order to help decrease urinary incontinence.    Time 4    Period Weeks    Target Date 09/29/21  PT Long Term Goals - 09/01/21 2153       PT LONG TERM GOAL #1    Title Pt will be independent with advanced HEP.    Time 8    Period Weeks    Status New    Target Date 10/27/21      PT LONG TERM GOAL #2   Title Pt will report no leaks with laughing, coughing, sneezing, or jumping in order to improve comfort with interpersonal relationships and community activities.    Time 8    Period Weeks    Status New    Target Date 10/27/21      PT LONG TERM GOAL #3   Title Pt will demonstrate 4/5 pelvic floor muscle strength and >10 second endurance with appropriate coordination in order to decrease urinary incontinence.    Time 8    Period Weeks    Status New    Target Date 10/27/21      PT LONG TERM GOAL #4   Title Pt will have good understanding of appropriate pressure management and be able to incorporate into functional strengthening program in order to prevent stress incontinence and worsening of vaginal wall laxity.    Time 8    Period Weeks    Status New    Target Date 10/27/21                    Plan - 09/01/21 1537     Clinical Impression Statement Pt is a 36 year old female with chief complaint of stress urinary incontinence with laughing, coughing, sneezing, and jumping for the last year. Exam findings notable for pelvic floor weakness 2/5, decreased pelvic floor endurance 4 seconds, mild tenderness at Lt peri-urethra, and grade 2 anterior vaginal wall laxity. Signs and symptoms are most consistent with pelvic floor weakness, poor pressure management, and anteiror vaginal wall laxity. Initial treatment included pelvic floor contraction training with progression to quick flicks and long holds; we discussed use of the knack and importance of pressure management. She will benefit from skilled PT intervention in order to address impairments, decrease stress incontinence, and improve QOL.    Personal Factors and Comorbidities Comorbidity 1    Comorbidities G2P3    Examination-Activity Limitations Continence    Examination-Participation  Restrictions Community Activity    Stability/Clinical Decision Making Stable/Uncomplicated    Clinical Decision Making Low    Rehab Potential Good    PT Frequency 1x / week    PT Treatment/Interventions ADLs/Self Care Home Management;Biofeedback;Cryotherapy;Electrical Stimulation;Moist Heat;Therapeutic activities;Therapeutic exercise;Neuromuscular re-education;Manual techniques;Patient/family education;Scar mobilization;Passive range of motion;Dry needling;Spinal Manipulations    PT Next Visit Plan Plan to continue manual techniques to improve pelvic floor strength/coordination; progress strengthening to functional positions; begin core training and incorporate in to exercise with appropriate pressure management.    PT Home Exercise Plan [email or web address]    Consulted and Agree with Plan of Care Patient             Patient will benefit from skilled therapeutic intervention in order to improve the following deficits and impairments:  Decreased coordination, Decreased range of motion, Decreased endurance, Increased muscle spasms, Pain, Decreased activity tolerance, Hypomobility, Decreased mobility, Decreased strength  Visit Diagnosis: Muscle weakness (generalized)  Unspecified lack of coordination     Problem List Patient Active Problem List   Diagnosis Date Noted   Pregnancy 08/23/2018    Julio Alm, PT, DPT02/02/239:57 PM   [facility] @ [facility] [address]  [place], [place], [address] Phone: [phone]   Fax:  [phone]  Name: Doylene CanardMuddsara Lown MRN: [id number] Date of Birth: 1986-06-07

## 2021-09-28 ENCOUNTER — Ambulatory Visit: Payer: Medicaid Other | Attending: Obstetrics and Gynecology

## 2021-09-28 ENCOUNTER — Other Ambulatory Visit: Payer: Self-pay

## 2021-09-28 DIAGNOSIS — R279 Unspecified lack of coordination: Secondary | ICD-10-CM | POA: Insufficient documentation

## 2021-09-28 DIAGNOSIS — M6281 Muscle weakness (generalized): Secondary | ICD-10-CM | POA: Insufficient documentation

## 2021-09-28 NOTE — Therapy (Addendum)
[facility] @ [place], [place], [address] Phone: [phone]   Fax:  [phone]  Physical Therapy Treatment  Patient Details  Name: Vicki Bright MRN: [id number] Date of Birth: 06-10-1986 Referring Provider (PT): Allyn Kenner, DO   Encounter Date: 09/28/2021   PT End of Session - 09/28/21 0949     Visit Number 2    Date for PT Re-Evaluation 10/27/21    Authorization Type Medicaid Healthy Blue    Authorization Time Period 09/05/21-10/27/21    Authorization - Visit Number 1    Authorization - Number of Visits 8    PT Start Time [phone]    PT Stop Time 1012    PT Time Calculation (min) 24 min    Activity Tolerance Patient tolerated treatment well    Behavior During Therapy [facility] for tasks assessed/performed             Past Medical History:  Diagnosis Date   Medical history non-contributory     Past Surgical History:  Procedure Laterality Date   NO PAST SURGERIES      There were no vitals filed for this visit.   Subjective Assessment - 09/28/21 0950     Subjective Pt states that she has not been working on exercise sat home and has not seen any improvement in overall condition.    Patient Stated Goals To stop leaking    Currently in Pain? No/denies    Multiple Pain Sites No                     No emotional/communication barriers or cognitive limitation. Patient is motivated to learn. Patient understands and agrees with treatment goals and plan. PT explains patient will be examined in standing, sitting, and lying down to see how their muscles and joints work. When they are ready, they will be asked to remove their underwear so PT can examine their perineum. The patient is also given the option of providing their own chaperone as one is not provided in our facility. The patient also has the right and is explained the right to defer or refuse any part of the evaluation or treatment including  the internal exam. With the patient's consent, PT will use one gloved finger to gently assess the muscles of the pelvic floor, seeing how well it contracts and relaxes and if there is muscle symmetry. After, the patient will get dressed and PT and patient will discuss exam findings and plan of care. PT and patient discuss plan of care, schedule, attendance policy and HEP activities.           [facility] Adult PT Treatment/Exercise - 09/28/21 0001       Self-Care   Self-Care Other Self-Care Comments   Bladder journal, the knack, bladder irritants     Neuro Re-ed    Neuro Re-ed Details  Pelvic floor contraction training with VC/TCs and breath coordination; 2 x 10 quick flicks; 5 x 10 sec long holds                     PT Education - 09/28/21 0953     Education Details Pt education performed on the knack, bladder journal, review of pelvic floor strengthening program, and pressure management.    Methods Explanation;Demonstration;Tactile cues;Verbal cues;Handout    Comprehension Verbalized understanding              PT Short Term Goals - 09/01/21 2151  PT SHORT TERM GOAL #1   Title Pt will be independent with HEP.    Time 4    Period Weeks    Status New    Target Date 09/29/21      PT SHORT TERM GOAL #2   Title Pt will be independent in the knack in order to help decrease urinary incontinence.    Time 4    Period Weeks    Target Date 09/29/21               PT Long Term Goals - 09/01/21 2153       PT LONG TERM GOAL #1   Title Pt will be independent with advanced HEP.    Time 8    Period Weeks    Status New    Target Date 10/27/21      PT LONG TERM GOAL #2   Title Pt will report no leaks with laughing, coughing, sneezing, or jumping in order to improve comfort with interpersonal relationships and community activities.    Time 8    Period Weeks    Status New    Target Date 10/27/21      PT LONG TERM GOAL #3   Title Pt will demonstrate 4/5  pelvic floor muscle strength and >10 second endurance with appropriate coordination in order to decrease urinary incontinence.    Time 8    Period Weeks    Status New    Target Date 10/27/21      PT LONG TERM GOAL #4   Title Pt will have good understanding of appropriate pressure management and be able to incorporate into functional strengthening program in order to prevent stress incontinence and worsening of vaginal wall laxity.    Time 8    Period Weeks    Status New    Target Date 10/27/21                   Plan - 09/28/21 1013     Clinical Impression Statement Pt has not seen any change in condition, most likely due to not performing any pelvic floor strengthening in the last month since first appointment. We did review how to perform quick flicks and long holds and she continued to have difficulty with strength and coordination of this contraction. With multi-modal cues, she was able to make good improvements. We disucssed bladder journal and how it may be beneficial; she was agreeable toworking on over the next week. She will continue to benefit from skilled PT intervention in order to decrease urinary leaking and progress strengthening.    PT Treatment/Interventions ADLs/Self Care Home Management;Biofeedback;Cryotherapy;Electrical Stimulation;Moist Heat;Therapeutic activities;Therapeutic exercise;Neuromuscular re-education;Manual techniques;Patient/family education;Scar mobilization;Passive range of motion;Dry needling;Spinal Manipulations    PT Next Visit Plan Progress pelvic floor strengthening to various standing positions; begin core/hip strengthening program with good pressure management.    PT Home Exercise Plan [email or web address]    Consulted and Agree with Plan of Care Patient             Patient will benefit from skilled therapeutic intervention in order to improve the following deficits and impairments:  Decreased coordination, Decreased range of motion, Decreased  endurance, Increased muscle spasms, Pain, Decreased activity tolerance, Hypomobility, Decreased mobility, Decreased strength  Visit Diagnosis: Muscle weakness (generalized)  Unspecified lack of coordination     Problem List Patient Active Problem List   Diagnosis Date Noted   Pregnancy 08/23/2018    Heather Roberts, PT, DPT03/07/2308:16 AM   [facility] @ [place], [place], [address] Phone: [phone]   Fax:  [phone]  Name: Rosaline Ezekiel MRN: [id number] Date of Birth: 1985-10-23  PHYSICAL THERAPY DISCHARGE SUMMARY  Visits from Start of Care: 2  Current functional level related to goals / functional outcomes: Incomplete   Remaining deficits: See above   Education / Equipment: HEP   Patient agrees to discharge. Patient goals were not met. Patient is being discharged due to not returning since the last visit.  Heather Roberts, PT, DPT06/05/239:42 PM

## 2021-09-28 NOTE — Patient Instructions (Addendum)
The knack: ?Use this technique while coughing, laughing, sneezing, or with any activities ?that causes you to leak urine a little. ?Right before you perform one of these activities that increase pressure in ?the abdomen and pushes a little urine out, perform a pelvic floor muscle ?contraction and hold. ?If that does not completely stop the leaking, try tightening your thighs ?together in addition to performing a pelvic floor muscle contraction. ?Make sure you are not trying to stifle a cough, sneeze, or laugh; allow these ?activities in full as it will cause less pressure down into the bladder and ?pelvic floor muscles. Knack ? ? ?Access Code: [email or web address] ?URL: [email or web address] ?Date: 09/28/2021 ?Prepared by: Heather Roberts ? ?Exercises ?Supine Pelvic Floor Contraction - 3 x daily - 7 x weekly - 1 sets - 5 reps - 10 hold ?Quick Flick Pelvic Floor Contractions in Hooklying - 3 x daily - 7 x weekly - 2 sets - 10 reps - 1 hold ? ?

## 2021-10-12 ENCOUNTER — Telehealth: Payer: Self-pay

## 2021-10-12 NOTE — Telephone Encounter (Signed)
Called and left message with patient about poor attendance. She was informed that we have cancelled her appointments, but can see her on a same day basis if she would like. She was encouraged to call back if this is what she would like to do.  ?

## 2022-05-30 ENCOUNTER — Other Ambulatory Visit: Payer: Self-pay

## 2022-05-30 ENCOUNTER — Emergency Department (HOSPITAL_COMMUNITY): Payer: Medicaid Other

## 2022-05-30 ENCOUNTER — Emergency Department (HOSPITAL_COMMUNITY)
Admission: EM | Admit: 2022-05-30 | Discharge: 2022-05-30 | Disposition: A | Discharge status: Home / Self Care | Payer: Medicaid Other | Attending: Emergency Medicine | Admitting: Emergency Medicine

## 2022-05-30 DIAGNOSIS — Y92410 Unspecified street and highway as the place of occurrence of the external cause: Secondary | ICD-10-CM | POA: Diagnosis not present

## 2022-05-30 DIAGNOSIS — M546 Pain in thoracic spine: Secondary | ICD-10-CM | POA: Insufficient documentation

## 2022-05-30 DIAGNOSIS — S161XXA Strain of muscle, fascia and tendon at neck level, initial encounter: Secondary | ICD-10-CM | POA: Insufficient documentation

## 2022-05-30 DIAGNOSIS — M542 Cervicalgia: Secondary | ICD-10-CM | POA: Diagnosis present

## 2022-05-30 MED ORDER — METHOCARBAMOL 500 MG PO TABS: 500.0000 mg | ORAL_TABLET | Freq: Three times a day (TID) | ORAL | 0 refills | Status: AC | PRN
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
## 2022-05-30 NOTE — ED Notes (Addendum)
RN reviewed discharge instructions with pt. Pt verbalized understanding and had no further questions. VSS upon discharge.  

## 2022-05-30 NOTE — ED Notes (Signed)
Patient transported to CT
